# Patient Record
Sex: Male | Born: 1995 | Race: Black or African American | Hispanic: No | Marital: Married | Smoking: Never smoker
Health system: Southern US, Community
[De-identification: ages and names within clinical notes are randomized; demographics above are authoritative.]

## PROBLEM LIST (undated history)

## (undated) DIAGNOSIS — F909 Attention-deficit hyperactivity disorder, unspecified type: Secondary | ICD-10-CM

---
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
## 2017-05-01 IMAGING — DX DG CHEST 2V
2 series · 2 of 2 positions shown · non-contrast
Comparison: None.

CLINICAL DATA: 20-year-old male with intermittent fever and mid to
right lower abdominal pain for 3 days. Prior appendectomy. Initial
encounter.

EXAM:
CHEST  2 VIEW

[chest pa]
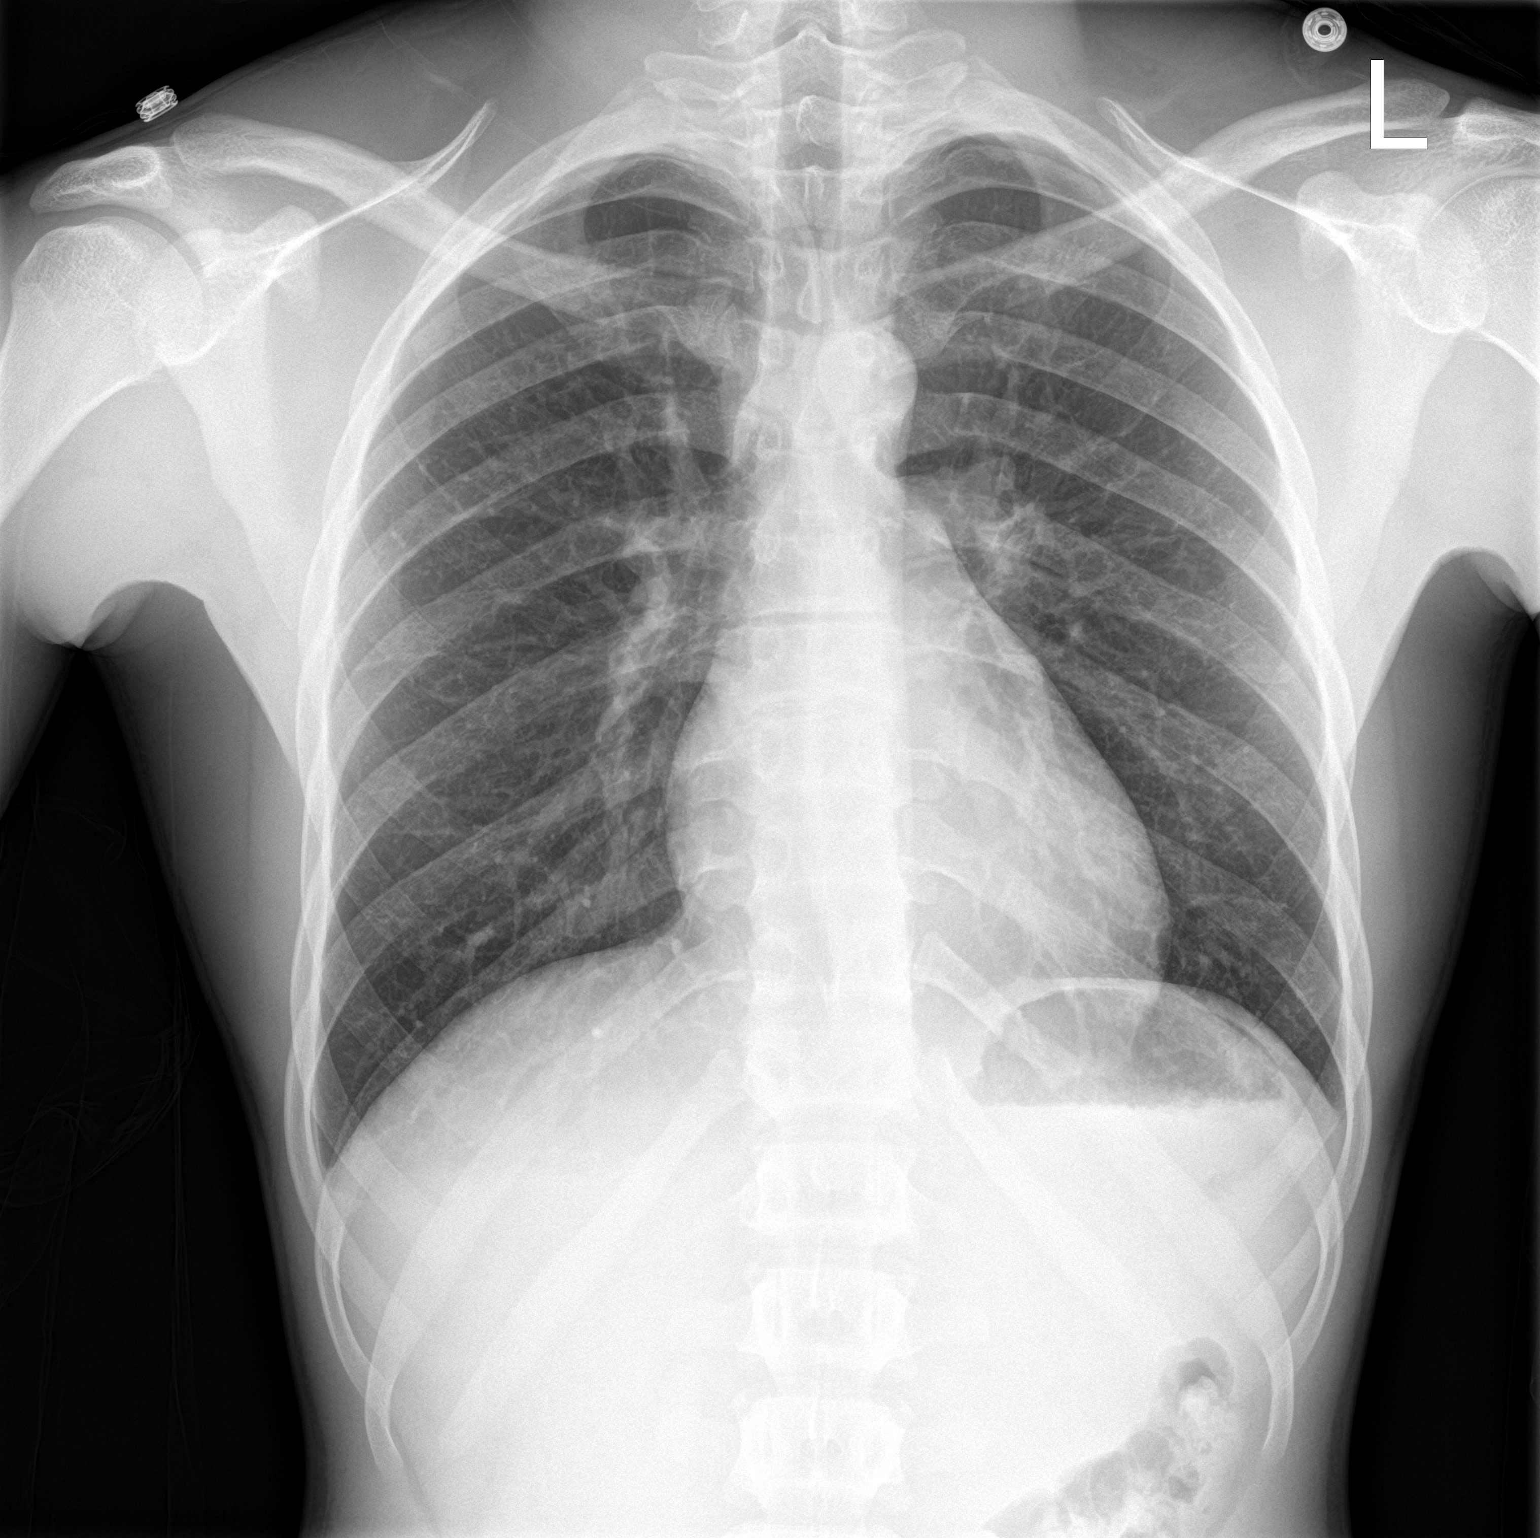

[chest lat]
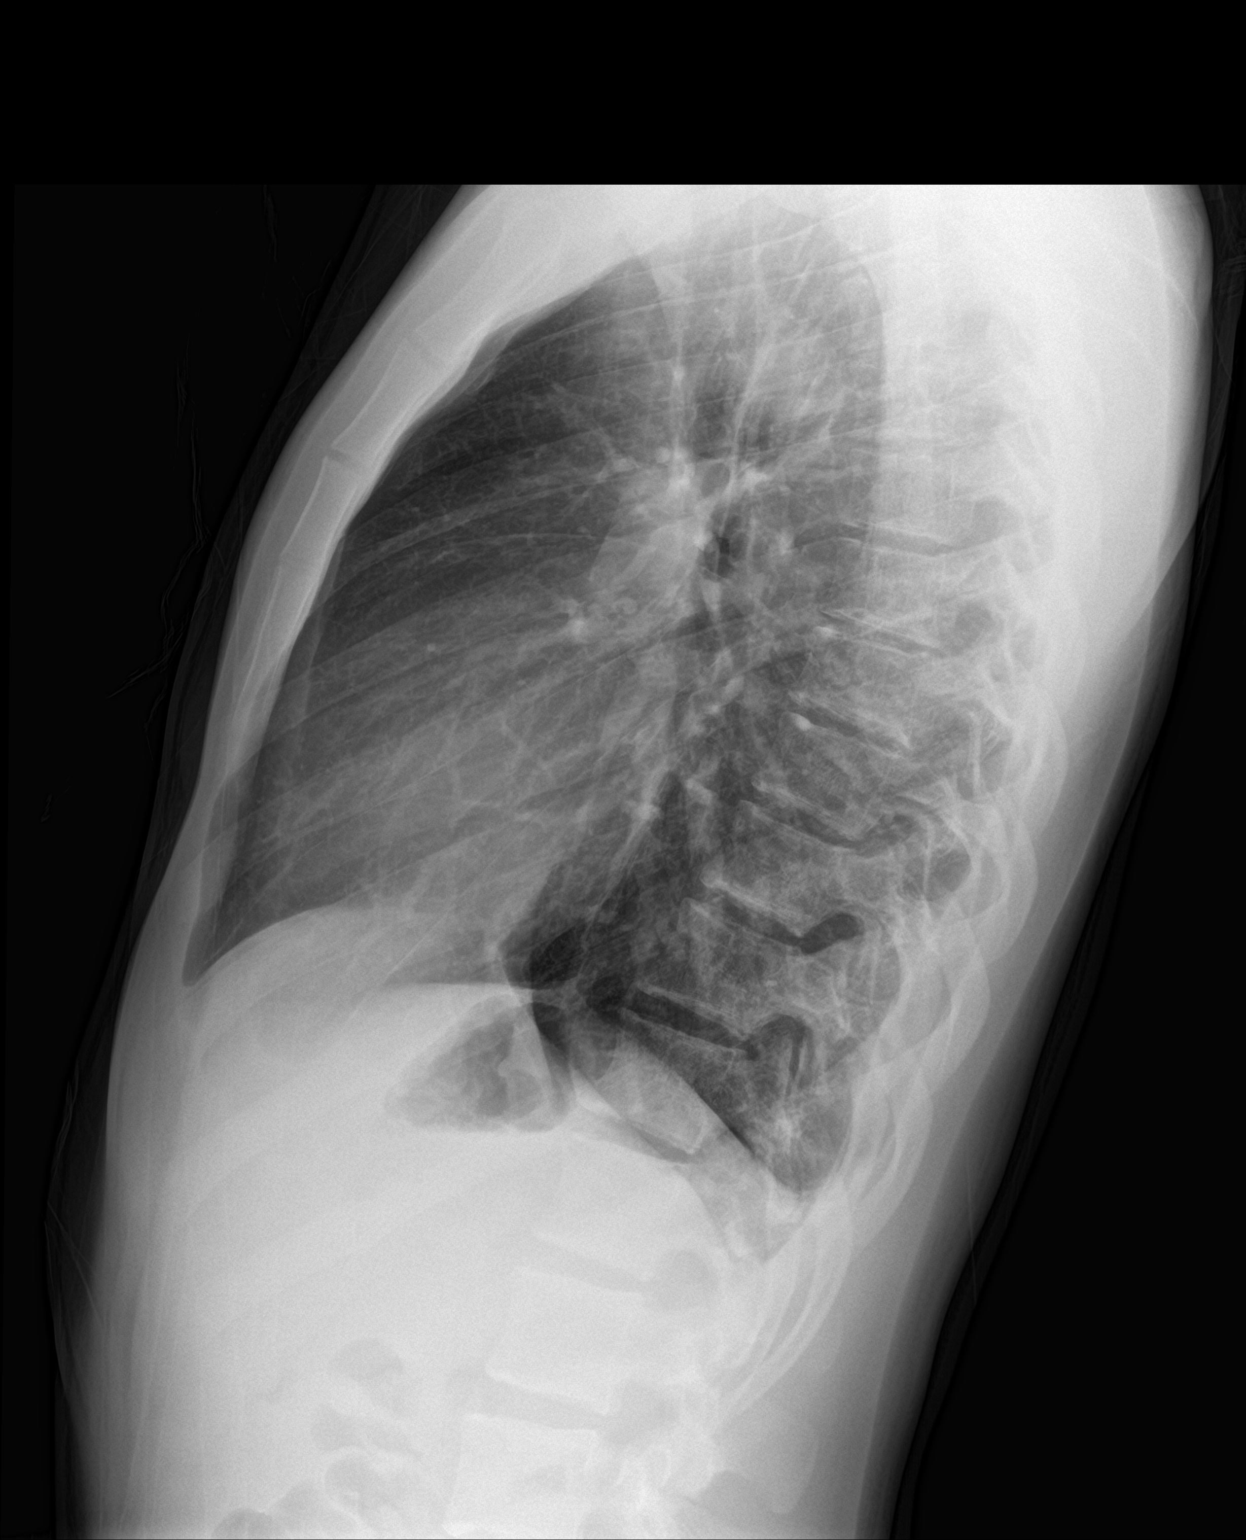

[2 of 2 positions shown; findings below may reference images not displayed]

FINDINGS: Cardiac size at the upper limits of normal. Other mediastinal
contours are within normal limits. Visualized tracheal air column is
within normal limits. Normal lung volumes. The lungs are clear. No
pneumothorax or pneumoperitoneum. Negative visible bowel gas
pattern. No osseous abnormality identified.
IMPRESSION: Negative.  No acute cardiopulmonary abnormality.

## 2017-05-01 IMAGING — CT CT ABD-PELV W/ CM
2 of 4 series · 12 of 46 positions shown, 14 images · IV contrast (Iodine)
Comparison: None.

CLINICAL DATA: Upper abdominal pain, fever, and intermittent
nausea. Previous appendectomy.

EXAM:
CT ABDOMEN AND PELVIS WITH CONTRAST
TECHNIQUE: Multidetector CT imaging of the abdomen and pelvis was performed
using the standard protocol following bolus administration of
intravenous contrast.
CONTRAST:  100mL OT5YZZ-288 IOPAMIDOL (OT5YZZ-288) INJECTION 61%

[Series 201: routine, idose (2) · axial · 0.61mm/px · z∈[-541,-146]mm · 9 of 95 slices shown, 11 images]
[im 8/95  soft-tissue]
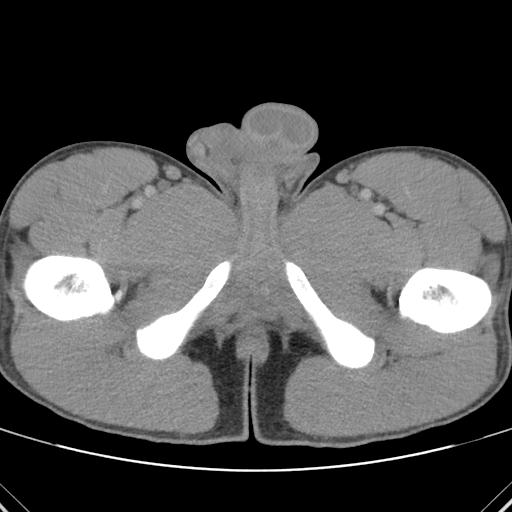
[im 8/95  bone]
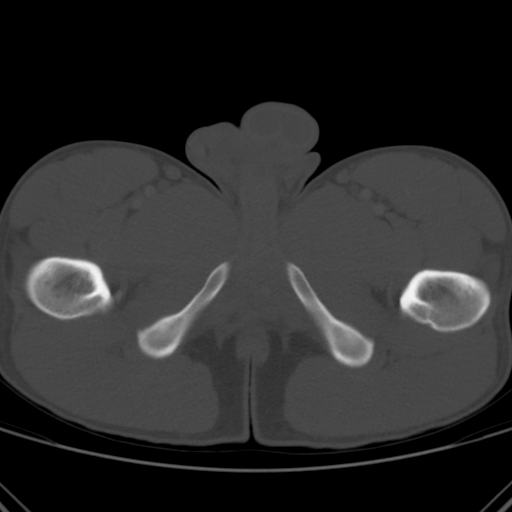
[im 16/95  soft-tissue]
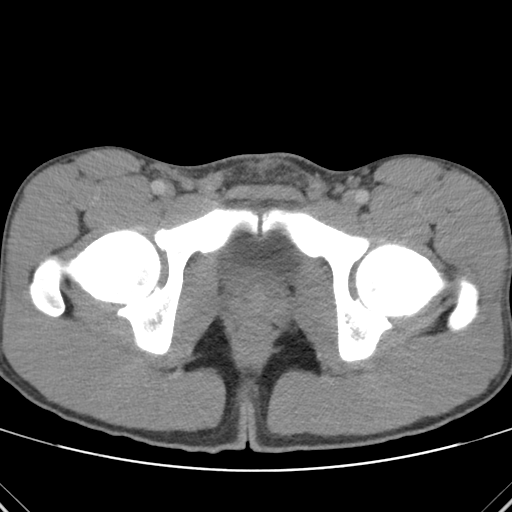
[im 28/95  soft-tissue]
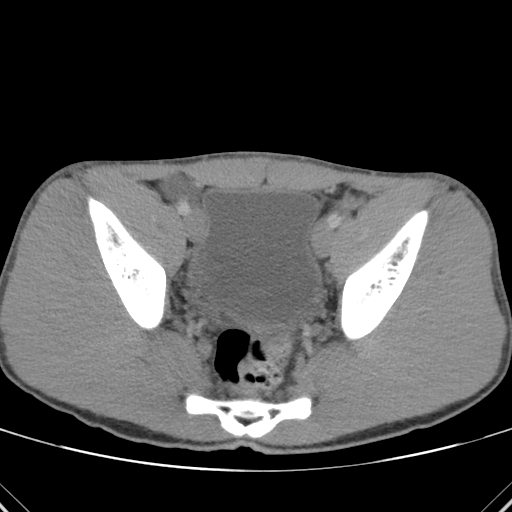
[im 36/95  soft-tissue]
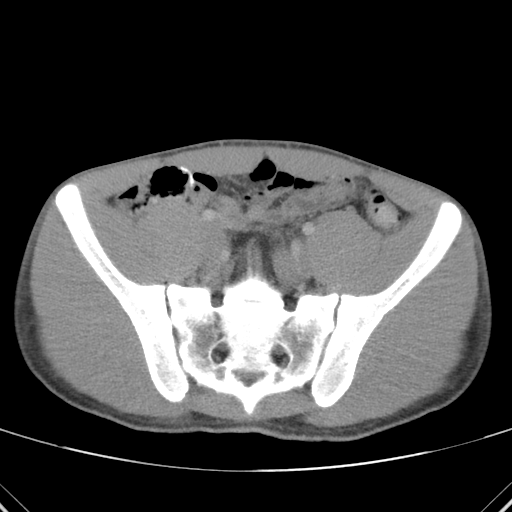
[im 48/95  soft-tissue]
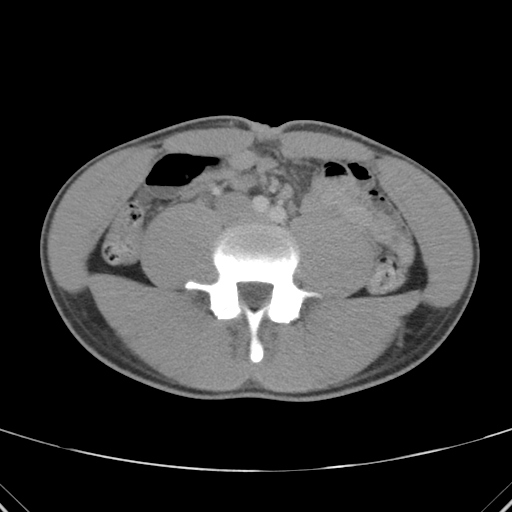
[im 59/95  soft-tissue]
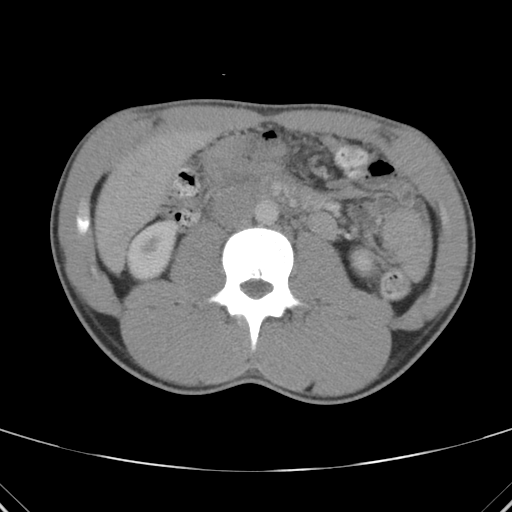
[im 67/95  soft-tissue]
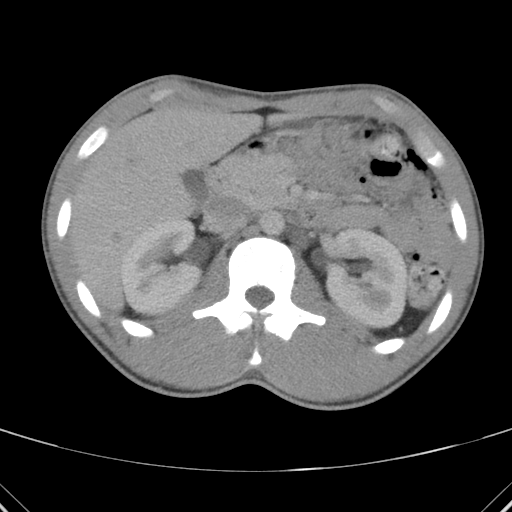
[im 79/95  soft-tissue]
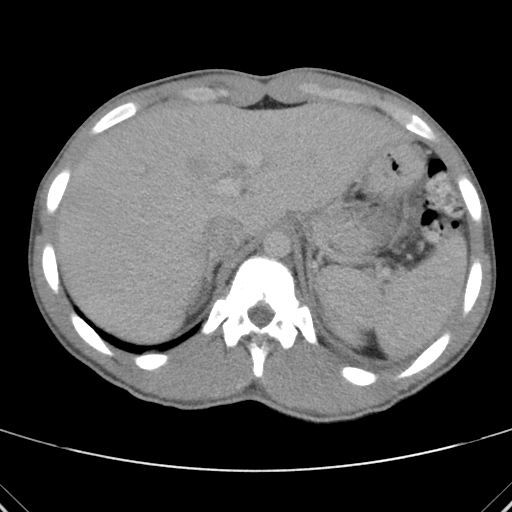
[im 87/95  soft-tissue]
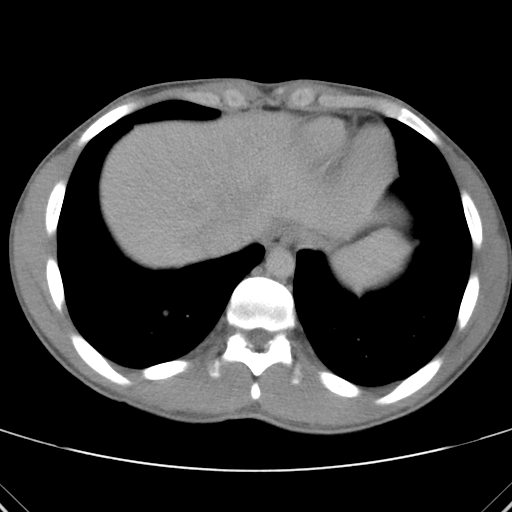
[im 87/95  bone]
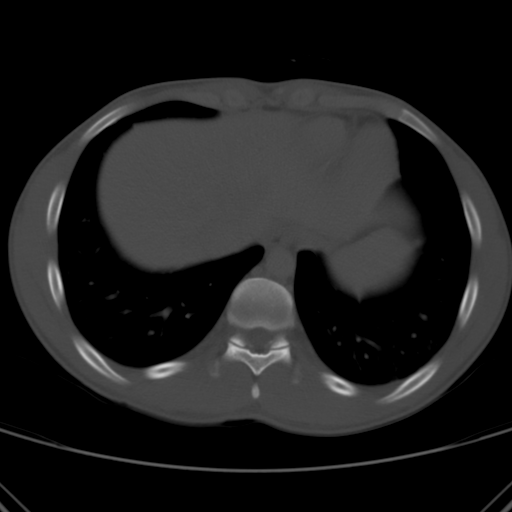

[Series 203: coronals, idose (2) · coronal · 0.45mm/px · 3 of 115 slices shown]
[im 39/115  soft-tissue]
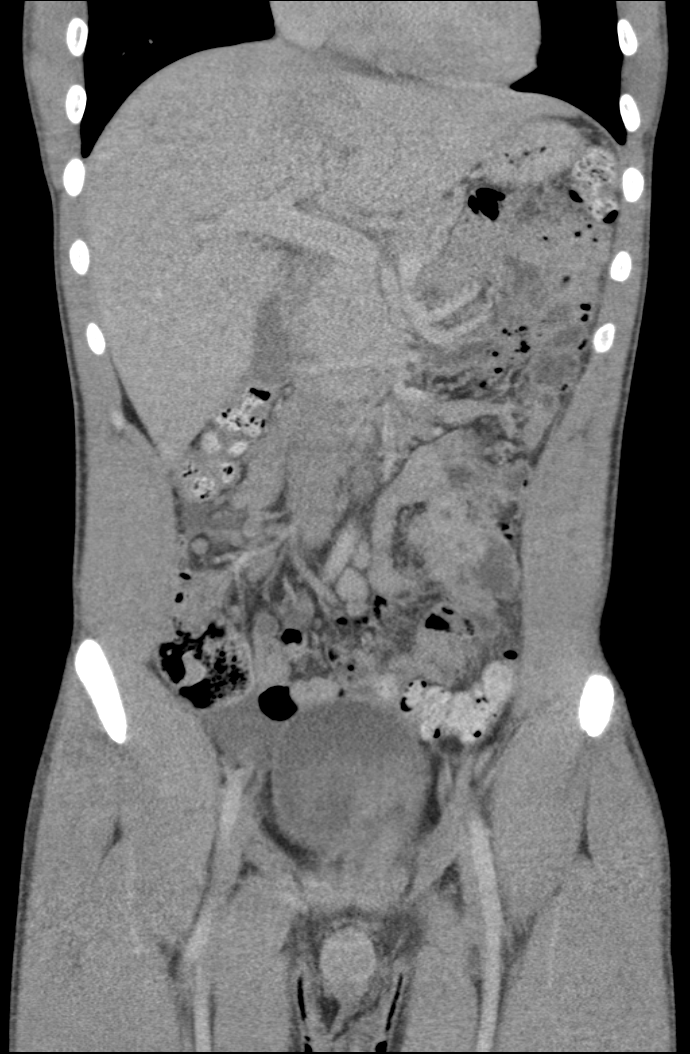
[im 51/115  soft-tissue]
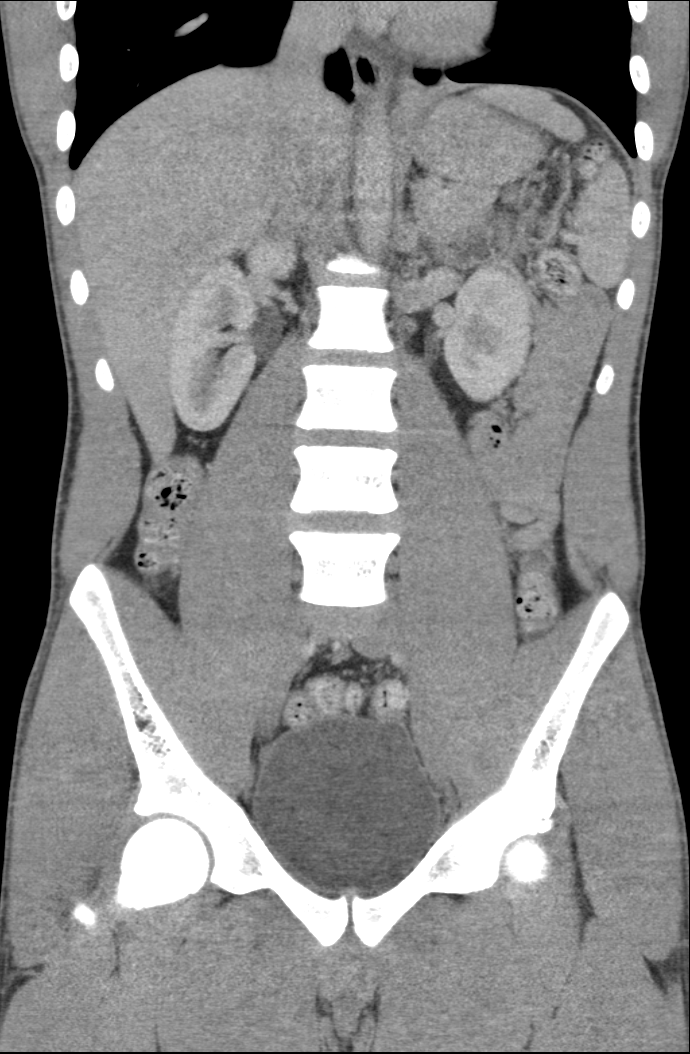
[im 64/115  soft-tissue]
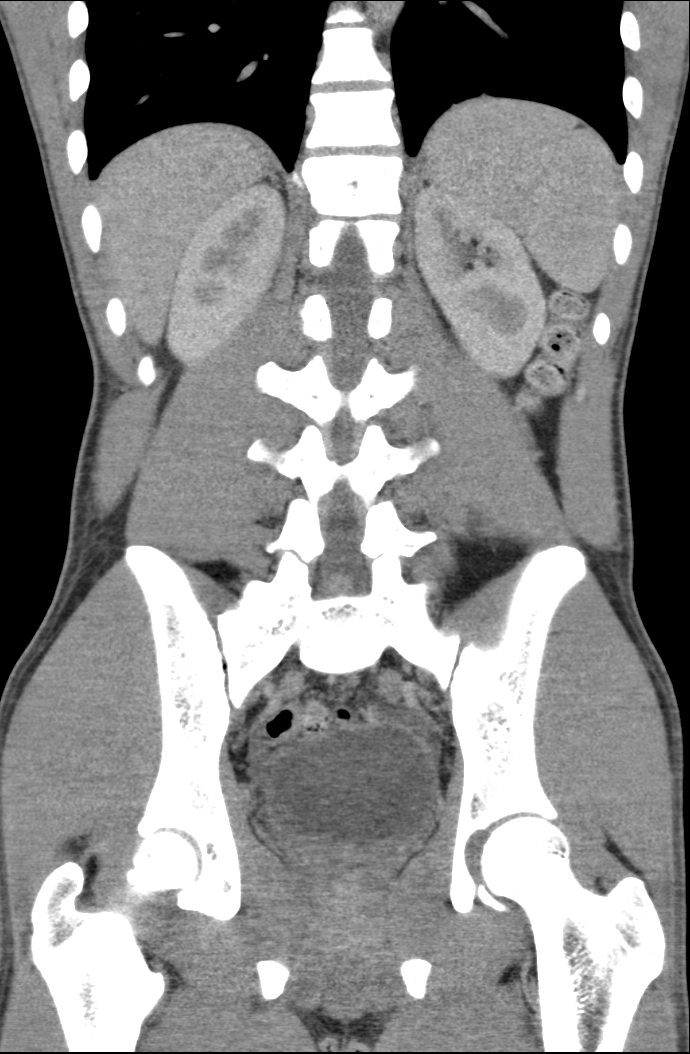

[12 of 46 positions shown; findings below may reference images not displayed]

FINDINGS: Lower Chest: Mild opacity seen in the posterior left lung base which
may be due to infiltrate or atelectasis.

Hepatobiliary:  No mass identified. Gallbladder is unremarkable.

Pancreas:  No mass or inflammatory changes.

Spleen:  Unremarkable.

Adrenals/Urinary Tract: No masses identified. No evidence of
hydronephrosis.

Stomach/Bowel: No evidence of dilated bowel loops or bowel wall
thickening. Mild stranding seen within the mesenteric fat.

Vascular/Lymphatic: Shotty less than 1 cm mesenteric lymph nodes
noted. No pathologically enlarged lymph nodes. No abdominal aortic
aneurysm.

Reproductive:  No mass identified.

Other:  None.

Musculoskeletal:  No suspicious bone lesions identified.
IMPRESSION: Sub-cm mesenteric lymph nodes and mild mesenteric stranding. These
findings are nonspecific but may be seen with mesenteric adenitis.

Mild opacity in posterior left lung base, which could represent mild
atelectasis or pneumonia.
